# Patient Record
Sex: Female | Born: 1960 | Race: White | Hispanic: No | Marital: Married | Smoking: Never smoker
Health system: Southern US, Community
[De-identification: ages and names within clinical notes are randomized; demographics above are authoritative.]

---

## 1999-04-30 ENCOUNTER — Other Ambulatory Visit: Admission: RE | Admit: 1999-04-30 | Discharge: 1999-04-30 | Payer: Self-pay | Admitting: Obstetrics & Gynecology

## 1999-09-23 ENCOUNTER — Other Ambulatory Visit: Admission: RE | Admit: 1999-09-23 | Discharge: 1999-09-23 | Payer: Self-pay | Admitting: Obstetrics & Gynecology

## 1999-10-28 ENCOUNTER — Other Ambulatory Visit: Admission: RE | Admit: 1999-10-28 | Discharge: 1999-10-28 | Payer: Self-pay | Admitting: Obstetrics & Gynecology

## 2000-01-15 ENCOUNTER — Other Ambulatory Visit: Admission: RE | Admit: 2000-01-15 | Discharge: 2000-01-15 | Payer: Self-pay | Admitting: Obstetrics & Gynecology

## 2002-02-07 ENCOUNTER — Other Ambulatory Visit: Admission: RE | Admit: 2002-02-07 | Discharge: 2002-02-07 | Payer: Self-pay | Admitting: Family Medicine

## 2005-06-10 ENCOUNTER — Encounter: Admission: RE | Admit: 2005-06-10 | Discharge: 2005-06-10 | Payer: Self-pay | Admitting: Nephrology

## 2007-02-25 IMAGING — US US RENAL
1 series · 14 of 25 positions shown · non-contrast
Comparison: none

CLINICAL DATA: Proteinuria

RENAL/URINARY TRACT ULTRASOUND:
TECHNIQUE: Complete ultrasound examination of the urinary tract was performed
including evaluation of the kidneys, renal collecting systems, and urinary
bladder.

[Series 1: unknown · 0.17mm/px · 14 of 27 slices shown]
[im 1/27]
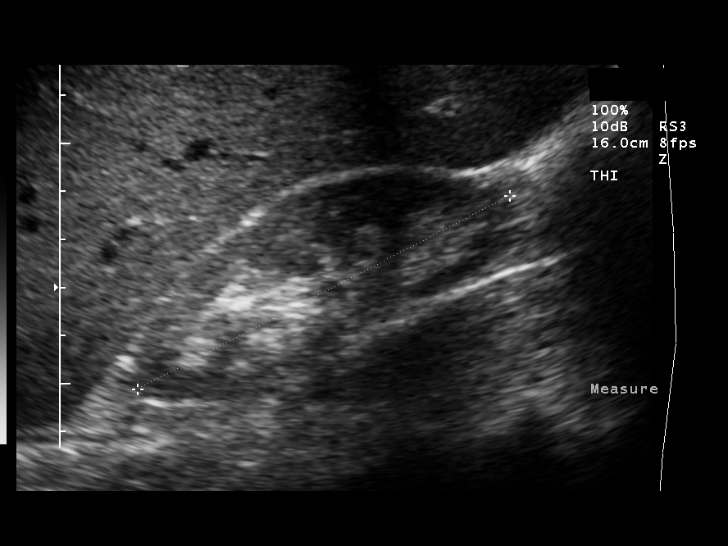
[im 3/27]
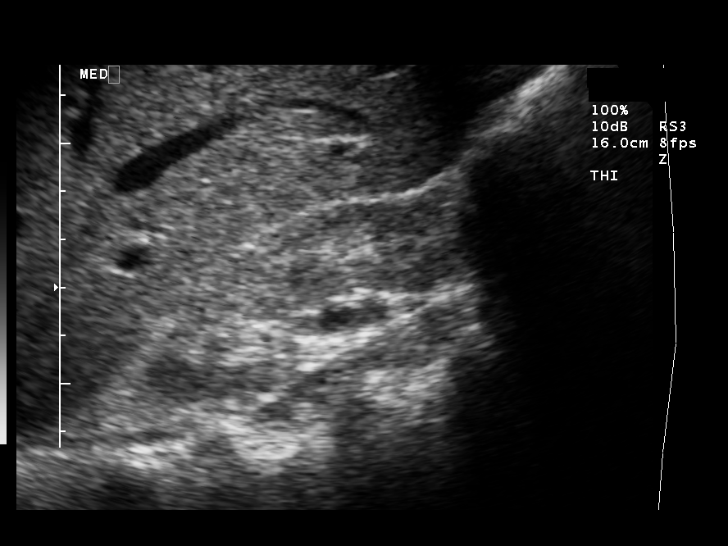
[im 5/27]
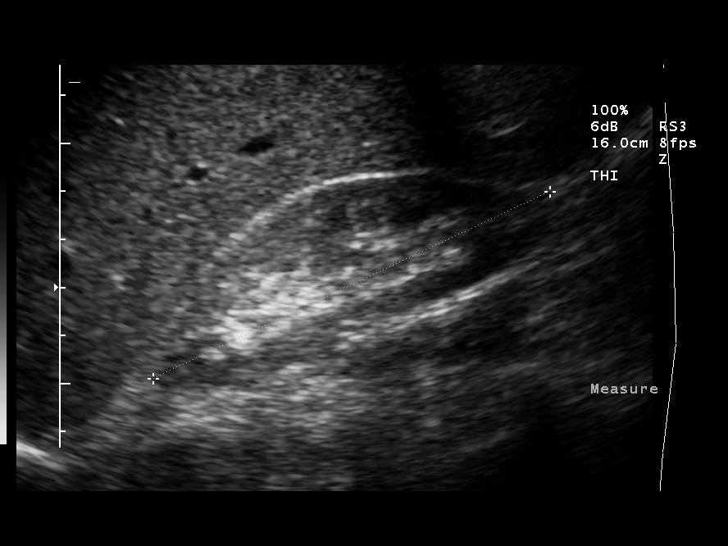
[im 7/27]
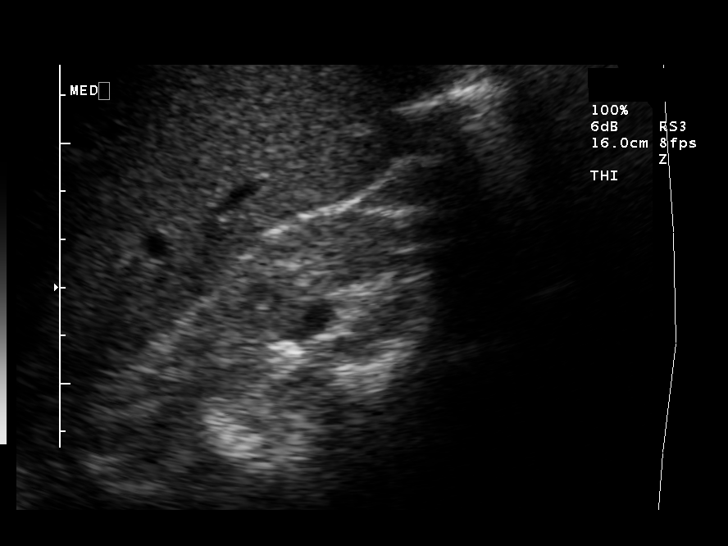
[im 9/27]
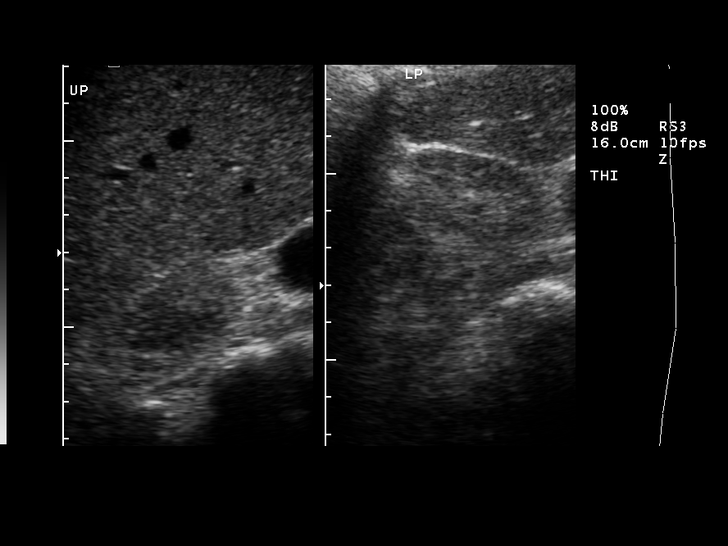
[im 10/27]
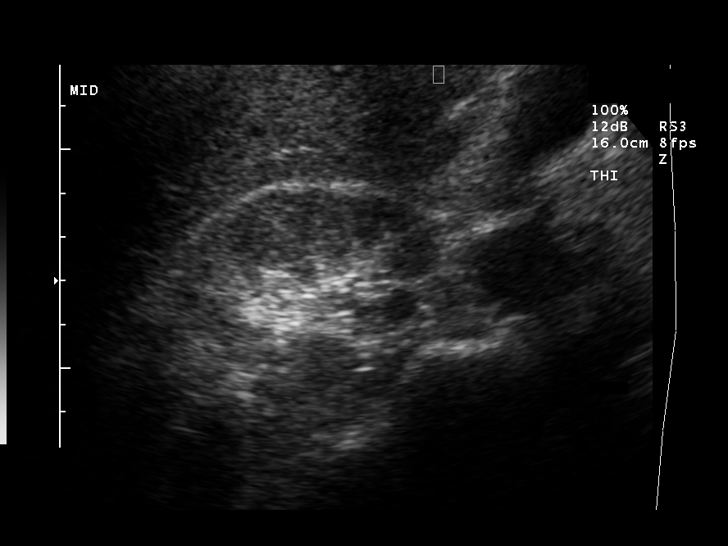
[im 12/27]
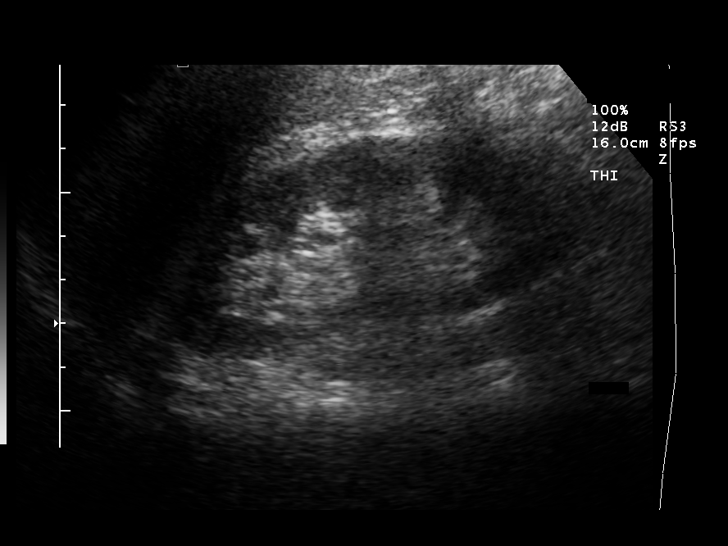
[im 15/27]
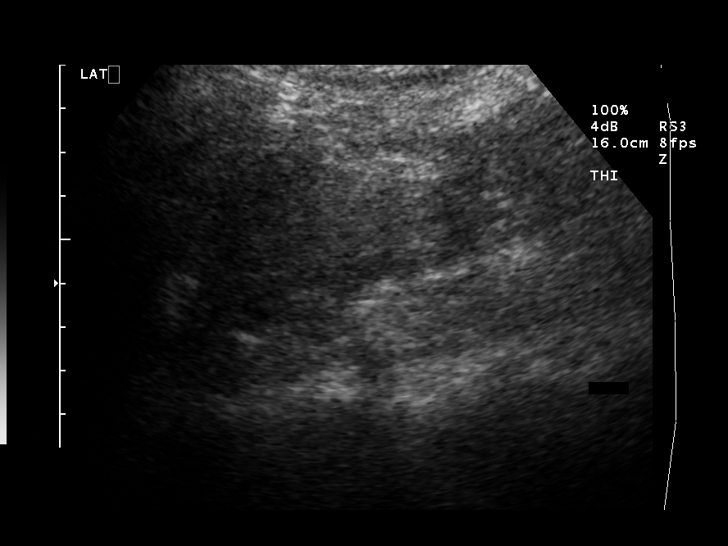
[im 17/27]
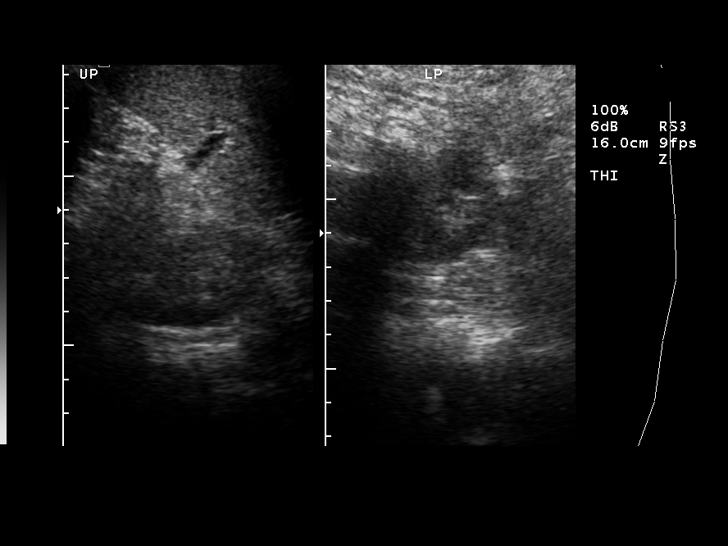
[im 18/27]
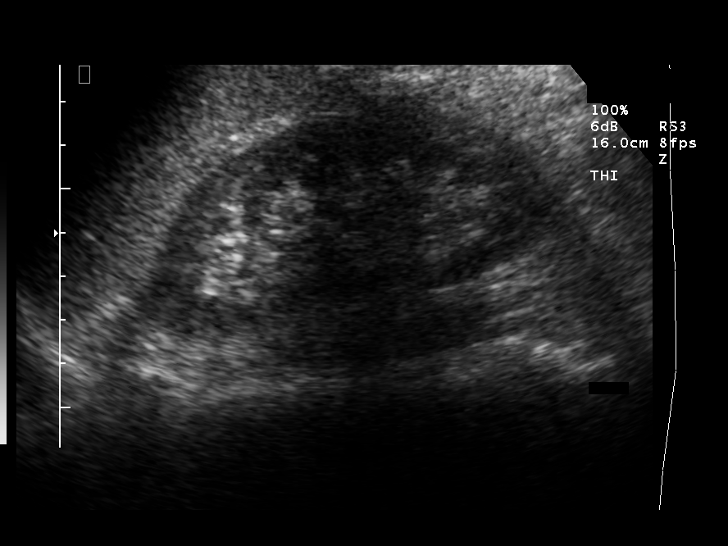
[im 20/27]
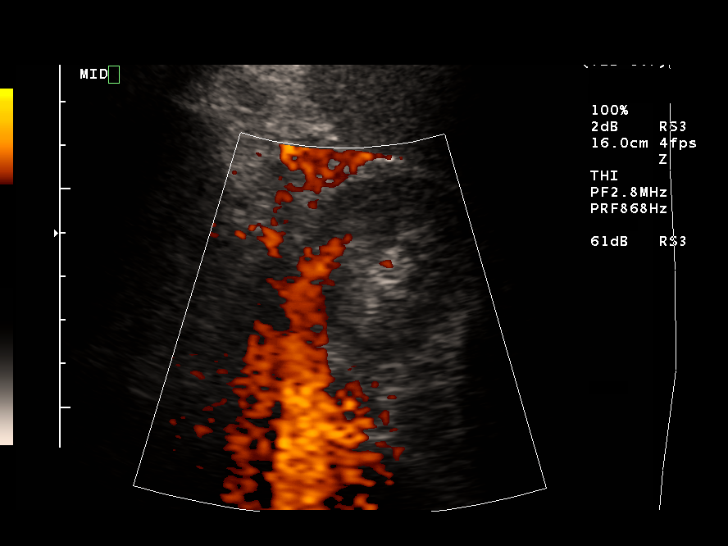
[im 22/27]
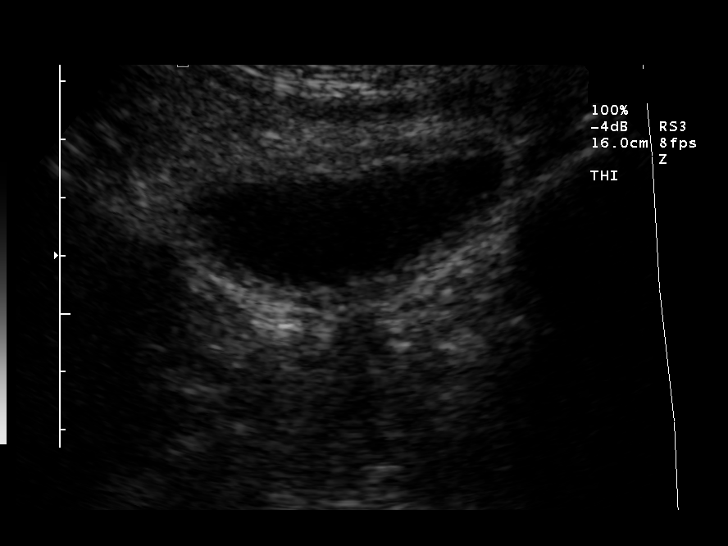
[im 24/27]
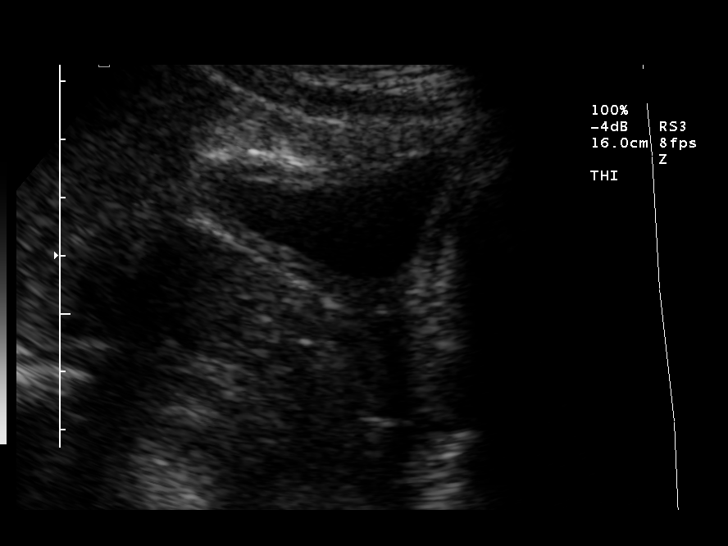
[im 27/27]
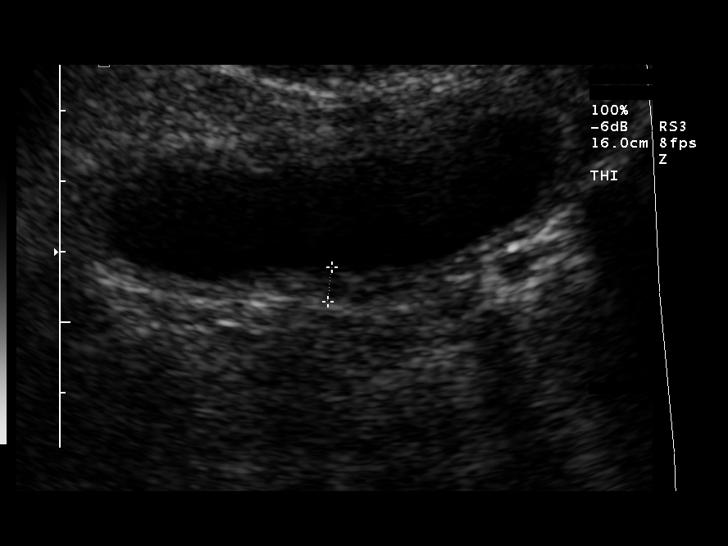

[14 of 25 positions shown; findings below may reference images not displayed]

FINDINGS: Both kidneys are within normal limits in size and parenchymal
echogenicity.  No renal parenchymal lesions are seen.  There is no evidence of
hydronephrosis.  No perinephric abnormalities are seen. Right kidney measures
9.3 cm. Left kidney measures 9.9 cm

Images of the urinary bladder are unremarkable for the degree of bladder
filling.
IMPRESSION: Normal study.

## 2013-04-12 ENCOUNTER — Ambulatory Visit (INDEPENDENT_AMBULATORY_CARE_PROVIDER_SITE_OTHER): Payer: BC Managed Care – PPO | Admitting: Family Medicine

## 2013-04-12 ENCOUNTER — Encounter: Payer: Self-pay | Admitting: Family Medicine

## 2013-04-12 VITALS — BP 120/80 | HR 84 | Temp 98.8°F | Resp 14 | Wt 100.0 lb

## 2013-04-12 DIAGNOSIS — H109 Unspecified conjunctivitis: Secondary | ICD-10-CM

## 2013-04-12 MED ORDER — TOBRAMYCIN-DEXAMETHASONE 0.3-0.1 % OP SUSP: 1.0000 [drp] | OPHTHALMIC | Status: AC

## 2013-04-12 NOTE — Progress Notes (Signed)
  Subjective:    Patient ID: Danielle Conrad, female    DOB: 02/20/1961, 52 y.o.   MRN: [id number]  HPI Patient reports 1 days of burning and itching in both eyes.  It is better after using saline eye drops.  She denies any known chemical exposure or history of allergic conjunctivitis.  She has had some sneezing and sinus congestion recently.  She denies any sick contacts.  She now has some mild erythema in the left conjunctiva.  She denies blurred vision or decreased visual acuity. No past medical history on file. No current outpatient prescriptions on file prior to visit.   No current facility-administered medications on file prior to visit.   Allergies  Allergen Reactions  . Sulfa Antibiotics    History   Social History  . Marital Status: Married    Spouse Name: N/A    Number of Children: N/A  . Years of Education: N/A   Occupational History  . Not on file.   Social History Main Topics  . Smoking status: Never Smoker   . Smokeless tobacco: Not on file  . Alcohol Use: No  . Drug Use: No  . Sexually Active: Not on file   Other Topics Concern  . Not on file   Social History Narrative  . No narrative on file     Review of Systems  All other systems reviewed and are negative.       Objective:   Physical Exam  Vitals reviewed. Eyes: Lids are normal. Pupils are equal, round, and reactive to light. No foreign bodies found. Right eye exhibits no chemosis, no discharge, no exudate and no hordeolum. No foreign body present in the right eye. Left eye exhibits no chemosis, no discharge, no exudate and no hordeolum. No foreign body present in the left eye. Right conjunctiva is injected. Left conjunctiva is injected. No scleral icterus. Right eye exhibits normal extraocular motion. Left eye exhibits normal extraocular motion.   fluroscein exam reveals no dendrites, abrasions, or ulcerations.       Assessment & Plan:  1. Conjunctivitis unspecified I feel this is likely  allergic.  I will prescribe TobraDex one drop every 4 hours and to each eye for 5-7 days. If symptoms worsen she is to return immediately. If they improve and then return off the topical steroid, I would recommend ophthalmology consult versus Patanol.

## 2020-01-18 ENCOUNTER — Ambulatory Visit: Payer: Self-pay | Attending: Internal Medicine

## 2020-01-18 DIAGNOSIS — Z23 Encounter for immunization: Secondary | ICD-10-CM | POA: Insufficient documentation

## 2020-01-18 NOTE — Progress Notes (Signed)
   Covid-19 Vaccination Clinic  Name:  Danielle Conrad    MRN: [id number] DOB: Sep 06, 1961  01/18/2020  [name] was observed post Covid-19 immunization for 15 minutes without incident. She was provided with Vaccine Information Sheet and instruction to access the V-Safe system.   [name] was instructed to call 911 with any severe reactions post vaccine: Marland Kitchen Difficulty breathing  . Swelling of face and throat  . A fast heartbeat  . A bad rash all over body  . Dizziness and weakness   Immunizations Administered    Name Date Dose VIS Date Route   Pfizer COVID-19 Vaccine 01/18/2020 12:06 PM 0.3 mL 10/27/2019 Intramuscular   Manufacturer: [employer], [employer]   Lot: [id number]   NDC: [address]-2

## 2020-02-14 ENCOUNTER — Ambulatory Visit: Payer: Self-pay | Attending: Internal Medicine

## 2020-02-14 DIAGNOSIS — Z23 Encounter for immunization: Secondary | ICD-10-CM

## 2020-02-14 NOTE — Progress Notes (Signed)
   Covid-19 Vaccination Clinic  Name:  Danielle Conrad    MRN: [id number] DOB: 07-Dec-1960  02/14/2020  [name] was observed post Covid-19 immunization for 15 minutes without incident. She was provided with Vaccine Information Sheet and instruction to access the V-Safe system.   [name] was instructed to call 911 with any severe reactions post vaccine: Marland Kitchen Difficulty breathing  . Swelling of face and throat  . A fast heartbeat  . A bad rash all over body  . Dizziness and weakness   Immunizations Administered    Name Date Dose VIS Date Route   Pfizer COVID-19 Vaccine 02/14/2020  8:28 AM 0.3 mL 10/27/2019 Intramuscular   Manufacturer: [employer], [employer]   Lot: [id number]   NDC: [address]-3

## 2023-11-18 DIAGNOSIS — K080 Exfoliation of teeth due to systemic causes: Secondary | ICD-10-CM | POA: Diagnosis not present

## 2024-03-08 DIAGNOSIS — K080 Exfoliation of teeth due to systemic causes: Secondary | ICD-10-CM | POA: Diagnosis not present

## 2024-09-26 DIAGNOSIS — K080 Exfoliation of teeth due to systemic causes: Secondary | ICD-10-CM | POA: Diagnosis not present
